# Patient Record
Sex: Male | Born: 1985 | Hispanic: No | Marital: Single | Smoking: Former smoker
Health system: Southern US, Community
[De-identification: ages and names within clinical notes are randomized; demographics above are authoritative.]

## PROBLEM LIST (undated history)

## (undated) ENCOUNTER — Ambulatory Visit (HOSPITAL_COMMUNITY): Admission: EM | Payer: 59

---

## 2009-08-30 ENCOUNTER — Emergency Department (HOSPITAL_COMMUNITY): Admission: EM | Admit: 2009-08-30 | Discharge: 2009-08-30 | Payer: Self-pay | Admitting: Emergency Medicine

## 2012-12-02 ENCOUNTER — Emergency Department (INDEPENDENT_AMBULATORY_CARE_PROVIDER_SITE_OTHER): Payer: BC Managed Care – PPO

## 2012-12-02 ENCOUNTER — Encounter (HOSPITAL_COMMUNITY): Payer: Self-pay | Admitting: Emergency Medicine

## 2012-12-02 ENCOUNTER — Emergency Department (HOSPITAL_COMMUNITY)
Admission: EM | Admit: 2012-12-02 | Discharge: 2012-12-02 | Disposition: A | Discharge status: Home / Self Care | Payer: BC Managed Care – PPO | Source: Home / Self Care | Attending: Family Medicine | Admitting: Family Medicine

## 2012-12-02 DIAGNOSIS — S62609A Fracture of unspecified phalanx of unspecified finger, initial encounter for closed fracture: Secondary | ICD-10-CM

## 2012-12-02 NOTE — ED Notes (Addendum)
C/o injury to right middle finger. States a ball hit finger. Swelling and unable to bend finger. Incident happened on Saturday.

## 2012-12-02 NOTE — ED Provider Notes (Signed)
[name]: [id number]     Arrival date & time 12/02/12  1252 History   First MD Initiated Contact with Patient 12/02/12 1512     Chief Complaint  Patient presents with  . Hand Injury    right middle finger. swelling and unable to bend   (Consider location/radiation/quality/duration/timing/severity/associated sxs/prior Treatment) HPI Comments: Pt was playing basketball and injured distal R middle finger. Used splint from [facility] but finger isn't getting better.   Patient is a 27 y.o. male presenting with hand injury. The history is provided by the patient.  Hand Injury Location:  Finger Time since incident:  2 days Finger location:  R middle finger Pain details:    Quality:  Throbbing and aching   Radiates to:  Does not radiate   Severity:  Mild   Onset quality:  Sudden   Duration:  2 days   Timing:  Constant   Progression:  Unchanged Chronicity:  New Dislocation: no   Relieved by:  Nothing Exacerbated by: trying to move/use finger. Ineffective treatments:  Immobilization Associated symptoms: swelling and tingling   Associated symptoms: no fever and no numbness     History reviewed. No pertinent past medical history. History reviewed. No pertinent past surgical history. History reviewed. No pertinent family history. History  Substance Use Topics  . Smoking status: Current Every Day Smoker -- 0.50 packs/day    Types: Cigarettes  . Smokeless tobacco: Not on file  . Alcohol Use: Yes    Review of Systems  Constitutional: Negative for fever and chills.  Musculoskeletal: Positive for joint swelling.       R middle finger injury  Skin: Negative for wound.  Neurological: Negative for numbness.    Allergies  Review of patient's allergies indicates no known allergies.  Home Medications  No current outpatient prescriptions on file. BP 146/84  Pulse 71  Temp(Src) 97.9 F (36.6 C) (Oral)  Resp 16  SpO2 100% Physical Exam  Constitutional: He is oriented to person, place, and  time. He appears well-developed and well-nourished. No distress.  Musculoskeletal:       Right hand: He exhibits decreased range of motion, tenderness, bony tenderness and swelling.  Swelling R middle finger, pain in distal phalanx and DIP, can't move DIP, can flex and extend PIP.   Neurological: He is alert and oriented to person, place, and time.  Skin: Skin is warm, dry and intact.    ED Course  Procedures (including critical care time) Labs Review Labs Reviewed - No data to display Imaging Review Dg Finger Middle Right  12/02/2012   CLINICAL DATA:  Traumatic injury with pain  EXAM: RIGHT MIDDLE FINGER 2+V  COMPARISON:  None.  FINDINGS: There is an oblique fracture through the dorsal aspect of the 3rd distal phalanx which extends to the distal interphalangeal joint. Mild displacement of the fracture fragments is noted. No other focal abnormality is seen.  IMPRESSION: Fracture through the 3rd distal phalanx.   Electronically Signed   By: Alcide Clever M.D.   On: 12/02/2012 14:28    EKG Interpretation     Ventricular Rate:    PR Interval:    QRS Duration:   QT Interval:    QTC Calculation:   R Axis:     Text Interpretation:              MDM   1. Finger fracture, right, closed, initial encounter   spoke with Molly Maduro, Dr. Ronie Spies PA. Dr. Mina Marble requests splinting and will see pt later this  week.  Offered pt pain medicine but pt declines.      Cathlyn Parsons, NP 12/02/12 1531

## 2012-12-06 NOTE — ED Provider Notes (Signed)
Medical screening examination/treatment/procedure(s) were performed by resident physician or non-physician practitioner and as supervising physician I was immediately available for consultation/collaboration.   Arian Murley DOUGLAS MD.   Minami Arriaga D Amilya Haver, MD 12/06/12 1844 

## 2014-05-10 ENCOUNTER — Encounter (HOSPITAL_BASED_OUTPATIENT_CLINIC_OR_DEPARTMENT_OTHER): Payer: Self-pay

## 2014-05-10 ENCOUNTER — Emergency Department (HOSPITAL_BASED_OUTPATIENT_CLINIC_OR_DEPARTMENT_OTHER)
Admission: EM | Admit: 2014-05-10 | Discharge: 2014-05-10 | Disposition: A | Discharge status: Home / Self Care | Payer: 59 | Attending: Emergency Medicine | Admitting: Emergency Medicine

## 2014-05-10 DIAGNOSIS — R0789 Other chest pain: Secondary | ICD-10-CM | POA: Diagnosis not present

## 2014-05-10 DIAGNOSIS — R079 Chest pain, unspecified: Secondary | ICD-10-CM | POA: Diagnosis present

## 2014-05-10 DIAGNOSIS — K219 Gastro-esophageal reflux disease without esophagitis: Secondary | ICD-10-CM | POA: Insufficient documentation

## 2014-05-10 DIAGNOSIS — Z720 Tobacco use: Secondary | ICD-10-CM | POA: Diagnosis not present

## 2014-05-10 DIAGNOSIS — Z79899 Other long term (current) drug therapy: Secondary | ICD-10-CM | POA: Insufficient documentation

## 2014-05-10 NOTE — ED Notes (Signed)
Dr. Dione BoozeJacobawitz stated patient is ready for discharge.

## 2014-05-10 NOTE — ED Notes (Signed)
Pt reports one months of intermittent palpitations and pain in different spots L chest wall.  Went to [facility], dx with gerd and given omeprazole which he states has helped pain but still is having pain.  Reports requested a stress test but has not received yet.

## 2014-05-10 NOTE — Discharge Instructions (Signed)
Chest Pain (Nonspecific) Keep your scheduled appointment for your stress test later this month. Ask your primary care physician to help you to stop smoking. It is often hard to give a diagnosis for the cause of chest pain. There is always a chance that your pain could be related to something serious, such as a heart attack or a blood clot in the lungs. You need to follow up with your doctor. HOME CARE  If antibiotic medicine was given, take it as directed by your doctor. Finish the medicine even if you start to feel better.  For the next few days, avoid activities that bring on chest pain. Continue physical activities as told by your doctor.  Do not use any tobacco products. This includes cigarettes, chewing tobacco, and e-cigarettes.  Avoid drinking alcohol.  Only take medicine as told by your doctor.  Follow your doctor's suggestions for more testing if your chest pain does not go away.  Keep all doctor visits you made. GET HELP IF:  Your chest pain does not go away, even after treatment.  You have a rash with blisters on your chest.  You have a fever. GET HELP RIGHT AWAY IF:   You have more pain or pain that spreads to your arm, neck, jaw, back, or belly (abdomen).  You have shortness of breath.  You cough more than usual or cough up blood.  You have very bad back or belly pain.  You feel sick to your stomach (nauseous) or throw up (vomit).  You have very bad weakness.  You pass out (faint).  You have chills. This is an emergency. Do not wait to see if the problems will go away. Call your local emergency services (911 in [place] Do not drive yourself to the hospital. MAKE SURE YOU:   Understand these instructions.  Will watch your condition.  Will get help right away if you are not doing well or get worse. Document Released: 07/12/2007 Document Revised: 01/28/2013 Document Reviewed: 07/12/2007 [facility] Patient Information 2015 [place], [place]. This information is  not intended to replace advice given to you by your health care provider. Make sure you discuss any questions you have with your health care provider.

## 2014-05-10 NOTE — ED Provider Notes (Addendum)
[name]: [id number]     Arrival date & time 05/10/14  1812 History  This chart was scribed for Doug Sou, MD by Tonye Royalty, ED Scribe. This patient was seen in room MH08/MH08 and the patient's care was started at 8:18 PM.    Chief Complaint  Patient presents with  . Chest Pain   The history is provided by the patient. No language interpreter was used.    HPI Comments: Scott Olsen is a 29 y.o. male who presents to the Emergency Department complaining of intermittent, sharp chest pain and palpitations with onset 1 month ago, lasting a few seconds at a time. He describes pain as sharp and locates it to central chest. He was seen at [facility] where he was diagnosed with GERD and prescribed Omeprazole. He states frequency decreased with Omeprazole, but states he has been having it more this week. He states it also affects the left lateral ribs and other areas now including left leg, toes, shoulder, arm, and fingers at times. He states symptoms usually occur after eating. He states it is not affected by exertion. He states he is scheduled for stress test at [facility] on April 13. He states he has PCP at [facility]. He denies history of DM, HTN, or HLD. He smokes. He denies FHx of MI or other health problems. He states he does not use any other medications regularly.patient is presently asymptomatic  History reviewed. No pertinent past medical history. medical history negative History reviewed. No pertinent past surgical history. No family history on file. History  Substance Use Topics  . Smoking status: Current Every Day Smoker -- 0.25 packs/day    Types: Cigarettes  . Smokeless tobacco: Not on file  . Alcohol Use: Yes    Review of Systems  Constitutional: Negative.   HENT: Negative.   Respiratory: Negative.   Cardiovascular: Positive for chest pain.  Gastrointestinal: Negative.   Musculoskeletal: Negative.   Skin: Negative.   Neurological: Negative.    Psychiatric/Behavioral: Negative.   All other systems reviewed and are negative.     Allergies  Review of patient's allergies indicates no known allergies.  Home Medications   Prior to Admission medications   Medication Sig Start Date End Date Taking? Authorizing Provider  omeprazole (PRILOSEC) 20 MG capsule Take 20 mg by mouth daily.   Yes Historical Provider, MD   BP 126/70 mmHg  Pulse 86  Temp(Src) 98.1 F (36.7 C) (Oral)  Resp 18  Ht  (1.803 m)  Wt 172 lb (78.019 kg)  BMI 24.00 kg/m2  SpO2 100% Physical Exam  Constitutional: He is oriented to person, place, and time. He appears well-developed and well-nourished.  HENT:  Head: Normocephalic and atraumatic.  Eyes: Conjunctivae are normal. Pupils are equal, round, and reactive to light.  Neck: Neck supple. No tracheal deviation present. No thyromegaly present.  Cardiovascular: Normal rate and regular rhythm.   No murmur heard. Pulmonary/Chest: Effort normal and breath sounds normal.  Abdominal: Soft. Bowel sounds are normal. He exhibits no distension. There is no tenderness.  Musculoskeletal: Normal range of motion. He exhibits no edema or tenderness.  Neurological: He is alert and oriented to person, place, and time. No cranial nerve deficit. Coordination normal.  Skin: Skin is warm and dry. No rash noted.  Psychiatric: He has a normal mood and affect.  Nursing note and vitals reviewed.   ED Course  Procedures (including critical care time)  DIAGNOSTIC STUDIES: Oxygen Saturation is 100% on room air, normal by my interpretation.  COORDINATION OF CARE: 8:27 PM Discussed treatment plan with patient at beside, the patient agrees with the plan and has no further questions at this time.   Labs Review Labs Reviewed - No data to display  Imaging Review No results found.   EKG Interpretation   Date/Time:  Sunday May 10 2014 18:46:49 EDT Ventricular Rate:  71 PR Interval:  158 QRS Duration: 86 QT  Interval:  358 QTC Calculation: 389 R Axis:   50 Text Interpretation:  Normal sinus rhythm Normal ECG No old tracing to  compare Confirmed by Ethelda ChickJACUBOWITZ  MD, Sandy Haye [phone]([id number]) on 05/10/2014 7:01:10 PM      MDM  Symptoms highly atypical for acute coronary syndrome in this young male with only risk factor being smoking withnormal EKG I counseled patient for 5 minutes on smoking cessation. Plan he is to keep his scheduled point for his stress test later this month. He is advised to ask his primary care physician to help him to stop smoking Diagnoses #1 atypical chest pain #2 tobacco abuse Final diagnoses:  None    I personally performed the services described in this documentation, which was scribed in my presence. The recorded information has been reviewed and considered.      Doug SouSam Yanilen Adamik, MD 05/10/14 [phone]  Doug SouSam Rasaan Brotherton, MD 05/10/14 2037

## 2014-08-18 ENCOUNTER — Emergency Department (HOSPITAL_COMMUNITY)
Admission: EM | Admit: 2014-08-18 | Discharge: 2014-08-18 | Disposition: A | Discharge status: Home / Self Care | Payer: 59 | Attending: Emergency Medicine | Admitting: Emergency Medicine

## 2014-08-18 ENCOUNTER — Emergency Department (HOSPITAL_COMMUNITY): Payer: 59

## 2014-08-18 ENCOUNTER — Encounter (HOSPITAL_COMMUNITY): Payer: Self-pay | Admitting: Emergency Medicine

## 2014-08-18 DIAGNOSIS — R42 Dizziness and giddiness: Secondary | ICD-10-CM | POA: Insufficient documentation

## 2014-08-18 DIAGNOSIS — R079 Chest pain, unspecified: Secondary | ICD-10-CM | POA: Diagnosis present

## 2014-08-18 DIAGNOSIS — Z720 Tobacco use: Secondary | ICD-10-CM | POA: Diagnosis not present

## 2014-08-18 DIAGNOSIS — R110 Nausea: Secondary | ICD-10-CM | POA: Insufficient documentation

## 2014-08-18 DIAGNOSIS — R5383 Other fatigue: Secondary | ICD-10-CM | POA: Diagnosis not present

## 2014-08-18 DIAGNOSIS — Z79899 Other long term (current) drug therapy: Secondary | ICD-10-CM | POA: Insufficient documentation

## 2014-08-18 LAB — CBC WITH DIFFERENTIAL/PLATELET
Basophils Absolute: 0 10*3/uL (ref 0.0–0.1)
Basophils Relative: 0 % (ref 0–1)
Eosinophils Absolute: 0.1 10*3/uL (ref 0.0–0.7)
Eosinophils Relative: 1 % (ref 0–5)
HCT: 44.3 % (ref 39.0–52.0)
Hemoglobin: 15.6 g/dL (ref 13.0–17.0)
Lymphocytes Relative: 35 % (ref 12–46)
Lymphs Abs: 2.1 10*3/uL (ref 0.7–4.0)
MCH: 31 pg (ref 26.0–34.0)
MCHC: 35.2 g/dL (ref 30.0–36.0)
MCV: 87.9 fL (ref 78.0–100.0)
Monocytes Absolute: 0.5 10*3/uL (ref 0.1–1.0)
Monocytes Relative: 8 % (ref 3–12)
Neutro Abs: 3.4 10*3/uL (ref 1.7–7.7)
Neutrophils Relative %: 56 % (ref 43–77)
Platelets: 92 10*3/uL — ABNORMAL LOW (ref 150–400)
RBC: 5.04 MIL/uL (ref 4.22–5.81)
RDW: 12.8 % (ref 11.5–15.5)
WBC: 6.1 10*3/uL (ref 4.0–10.5)

## 2014-08-18 LAB — BASIC METABOLIC PANEL
Anion gap: 9 (ref 5–15)
BUN: 11 mg/dL (ref 6–20)
CO2: 23 mmol/L (ref 22–32)
Calcium: 9.6 mg/dL (ref 8.9–10.3)
Chloride: 106 mmol/L (ref 101–111)
Creatinine, Ser: 1.11 mg/dL (ref 0.61–1.24)
GFR calc Af Amer: >60 mL/min (ref >60)
GFR calc non Af Amer: >60 mL/min (ref >60)
Glucose, Bld: 112 mg/dL — ABNORMAL HIGH (ref 65–99)
Potassium: 3.8 mmol/L (ref 3.5–5.1)
Sodium: 138 mmol/L (ref 135–145)

## 2014-08-18 LAB — I-STAT TROPONIN, ED: Troponin i, poc: 0.01 ng/mL (ref 0.00–0.08)

## 2014-08-18 NOTE — ED Provider Notes (Signed)
[name]: [id number]     Arrival date & time 08/18/14  0865 History   First MD Initiated Contact with Patient 08/18/14 [phone]     Chief Complaint  Patient presents with  . Chest Pain  . Dizziness  . Fatigue     (Consider location/radiation/quality/duration/timing/severity/associated sxs/prior Treatment) HPI  29 year old male presents with 2 days of intermittent left-sided chest pain. The pain is a sharp, stabbing pain that comes and goes over a couple seconds. No associate shortness of breath or sweating. This morning he felt dizzy, tired, and nauseated and so he came in for evaluation. He has had chest pain similar to this 3 months ago. He was scheduled to get a stress test but was told that it was likely GERD so he did not follow-up with the stress test. The patient also complains of left arm pain but states that has been going on for several weeks and is always worse in the morning. He always sleeps on his left arm and thinks this is contributing. The arm pain in the chest pain did not occur the same time. Currently he has no chest pain. No pleuritic symptoms, cough, history of DVT, leg swelling, or history of cancer.  History reviewed. No pertinent past medical history. History reviewed. No pertinent past surgical history. No family history on file. History  Substance Use Topics  . Smoking status: Current Every Day Smoker -- 0.25 packs/day    Types: Cigarettes  . Smokeless tobacco: Not on file  . Alcohol Use: Yes    Review of Systems  Constitutional: Positive for fatigue.  Respiratory: Negative for shortness of breath.   Cardiovascular: Positive for chest pain.  Gastrointestinal: Positive for nausea. Negative for vomiting.  Neurological: Positive for dizziness and weakness.  All other systems reviewed and are negative.     Allergies  Review of patient's allergies indicates no known allergies.  Home Medications   Prior to Admission medications   Medication Sig Start Date End  Date Taking? Authorizing Provider  omeprazole (PRILOSEC) 20 MG capsule Take 20 mg by mouth daily.    Historical Provider, MD   BP 117/76 mmHg  Pulse 71  Temp(Src) 98.9 F (37.2 C)  Resp 17  Ht  (1.803 m)  Wt 159 lb 8 oz (72.349 kg)  BMI 22.26 kg/m2  SpO2 100% Physical Exam  Constitutional: He is oriented to person, place, and time. He appears well-developed and well-nourished.  HENT:  Head: Normocephalic and atraumatic.  Right Ear: External ear normal.  Left Ear: External ear normal.  Nose: Nose normal.  Eyes: Right eye exhibits no discharge. Left eye exhibits no discharge.  Neck: Neck supple.  Cardiovascular: Normal rate, regular rhythm, normal heart sounds and intact distal pulses.   Pulses:      Radial pulses are 2+ on the right side, and 2+ on the left side.  Pulmonary/Chest: Effort normal and breath sounds normal. He exhibits no tenderness.  Abdominal: Soft. He exhibits no distension. There is no tenderness.  Musculoskeletal: He exhibits no edema.       Left shoulder: He exhibits normal range of motion and no tenderness.       Left upper arm: He exhibits no tenderness.  Neurological: He is alert and oriented to person, place, and time.  Skin: Skin is warm and dry.  Nursing note and vitals reviewed.   ED Course  Procedures (including critical care time) Labs Review Labs Reviewed  BASIC METABOLIC PANEL - Abnormal; Notable for the following:  Glucose, Bld 112 (*)    All other components within normal limits  CBC WITH DIFFERENTIAL/PLATELET - Abnormal; Notable for the following:    Platelets 92 (*)    All other components within normal limits  I-STAT TROPOININ, ED    Imaging Review Dg Chest 2 View  08/18/2014   CLINICAL DATA:  Four day history of intermittent chest pain and dizziness  EXAM: CHEST  2 VIEW  COMPARISON:  None.  FINDINGS: Lungs are clear. Heart size and pulmonary vascularity are normal. No adenopathy. No bone lesions.  IMPRESSION: No edema or  consolidation.   Electronically Signed   By: Bretta BangWilliam  Woodruff III M.D.   On: 08/18/2014 09:30     EKG Interpretation   Date/Time:  Tuesday August 18 2014 08:41:47 EDT Ventricular Rate:  71 PR Interval:  152 QRS Duration: 86 QT Interval:  338 QTC Calculation: 367 R Axis:   74 Text Interpretation:  Normal sinus rhythm Normal ECG no significant change  since April 2016 Confirmed by Criss AlvineGOLDSTON  MD, Kenyon Eshleman [phone]([id number]) on 08/18/2014  8:48:47 AM       EKG Interpretation  Date/Time:  Tuesday August 18 2014 09:50:50 EDT Ventricular Rate:  70 PR Interval:  158 QRS Duration: 96 QT Interval:  344 QTC Calculation: 371 R Axis:   68 Text Interpretation:  Sinus rhythm RSR' in V1 or V2, probably normal variant ST elev, probable normal early repol pattern No significant change since earlier in the day Confirmed by Shamarion Coots  MD, Damier Disano ([id number]) on 08/18/2014 9:56:04 AM       MDM   Final diagnoses:  Chest pain, unspecified chest pain type    Patient with atypical chest pain. His only risk factor is smoking which I have counseled him on stopping. His history is very unlikely to be dissection or pulmonary moles and. He is low risk for both with a negative PERC I dont feel further PE workup is needed. He is currently asymptomatic. EKG is unremarkable except for early repolarization, repeat EKG shows no different changes. With a negative troponin after a couple days of symptoms and did not feel further workup is indicated in the ED, he has a PCP and have recommended he follow up with them. Discussed strict return precautions.    Pricilla LovelessScott Corabelle Spackman, MD 08/18/14 1017

## 2014-08-18 NOTE — ED Notes (Signed)
Pt. Stated, I've had some chest pain off and on with dizziness since Sat.

## 2014-08-18 NOTE — Discharge Instructions (Signed)

## 2014-11-03 ENCOUNTER — Ambulatory Visit (INDEPENDENT_AMBULATORY_CARE_PROVIDER_SITE_OTHER): Payer: 59

## 2014-11-03 ENCOUNTER — Ambulatory Visit (INDEPENDENT_AMBULATORY_CARE_PROVIDER_SITE_OTHER): Payer: 59 | Admitting: Family Medicine

## 2014-11-03 VITALS — BP 122/80 | HR 105 | Temp 101.5°F | Resp 18 | Ht 71.0 in | Wt 161.0 lb

## 2014-11-03 DIAGNOSIS — J988 Other specified respiratory disorders: Secondary | ICD-10-CM | POA: Diagnosis not present

## 2014-11-03 DIAGNOSIS — R05 Cough: Secondary | ICD-10-CM

## 2014-11-03 DIAGNOSIS — Z720 Tobacco use: Secondary | ICD-10-CM | POA: Diagnosis not present

## 2014-11-03 DIAGNOSIS — J029 Acute pharyngitis, unspecified: Secondary | ICD-10-CM | POA: Diagnosis not present

## 2014-11-03 DIAGNOSIS — R059 Cough, unspecified: Secondary | ICD-10-CM

## 2014-11-03 LAB — POCT RAPID STREP A (OFFICE): Rapid Strep A Screen: NEGATIVE

## 2014-11-03 MED ORDER — BENZONATATE 100 MG PO CAPS: 100.0000 mg | ORAL_CAPSULE | Freq: Three times a day (TID) | ORAL | Status: DC | PRN

## 2014-11-03 MED ORDER — AZITHROMYCIN 250 MG PO TABS: ORAL_TABLET | ORAL | Status: DC

## 2014-11-03 MED ORDER — HYDROCODONE-HOMATROPINE 5-1.5 MG/5ML PO SYRP: 5.0000 mL | ORAL_SOLUTION | Freq: Every evening | ORAL | Status: DC | PRN

## 2014-11-03 NOTE — Progress Notes (Signed)
    MRN: [id number] DOB: 1985/10/28  Subjective:   Scott Olsen is a 29 y.o. male presenting for chief complaint of Sore Throat; Fever; and Nasal Congestion  Reports 1 day history of sore throat, fever, sinus congestion, intermittent cough. Smokes 4-5 cigarettes daily for the past 10 years. Denies sinus pain, itchy or watery red eyes, ear pain, ear drainage, tooth pain, chest pain, shob, wheezing, n/v, abdominal pain. Denies history of allergies, asthma. Denies any other aggravating or relieving factors, no other questions or concerns.  Scott Olsen Scott has no medications in their medication list. Also has No Known Allergies.  Scott Olsen  has no past medical history on file. Also  has no past surgical history on file.  Objective:   Vitals: BP 122/80 mmHg  Pulse 105  Temp(Src) 101.5 F (38.6 C) (Oral)  Resp 18  Ht  (1.803 m)  Wt 161 lb (73.029 kg)  BMI 22.46 kg/m2  SpO2 98%  Physical Exam  Constitutional: He is oriented to person, place, and time. He appears well-developed and well-nourished.  HENT:  TM's intact bilaterally, no effusions or erythema. Nares patent, nasal turbinates pink and moist, nasal passages patent. No sinus tenderness. Oropharynx clear, mucous membranes moist, dentition in good repair.  Eyes: Conjunctivae are normal. No scleral icterus.  Neck: Normal range of motion. Neck supple.  Cardiovascular: Normal rate, regular rhythm and intact distal pulses.  Exam reveals no gallop and no friction rub.   No murmur heard. Pulmonary/Chest: No respiratory distress. He has no wheezes. He has rales (upper left lung fields).  Lymphadenopathy:    He has no cervical adenopathy.  Neurological: He is alert and oriented to person, place, and time.  Skin: Skin is warm and dry. No rash noted. No erythema. No pallor.   Results for orders placed or performed in visit on 11/03/14 (from the past 24 hour(s))  POCT rapid strep A     Status: Normal   Collection Time:  11/03/14  8:16 PM  Result Value Ref Range   Rapid Strep A Screen Negative Negative   [facility] reading (PRIMARY) by  Dr. Conley Rolls and PA-Fardeen Steinberger. Chest - no acute process.  Assessment and Plan :   1. Respiratory infection 2. Sore throat 3. Cough - Likely viral process. Offered supportive care, patient is to start Azithromycin in 3 days if no improvement.  4. Tobacco use - Patient will try to quit on his own and consider starting oral medication in 4 weeks if he is unable to do so. He will follow up with me in that case.  Wallis Bamberg, PA-C [facility] [phone] 11/03/2014 7:56 PM

## 2014-11-05 LAB — CULTURE, GROUP A STREP: Organism ID, Bacteria: NORMAL

## 2014-11-06 NOTE — Progress Notes (Signed)
Agree wit A/P Dr Conley Rolls

## 2015-01-28 ENCOUNTER — Ambulatory Visit (INDEPENDENT_AMBULATORY_CARE_PROVIDER_SITE_OTHER): Payer: 59 | Admitting: Family Medicine

## 2015-01-28 ENCOUNTER — Ambulatory Visit (INDEPENDENT_AMBULATORY_CARE_PROVIDER_SITE_OTHER): Payer: 59

## 2015-01-28 VITALS — BP 122/72 | HR 82 | Temp 98.4°F | Resp 17 | Ht 70.5 in | Wt 165.0 lb

## 2015-01-28 DIAGNOSIS — D696 Thrombocytopenia, unspecified: Secondary | ICD-10-CM | POA: Diagnosis not present

## 2015-01-28 DIAGNOSIS — R1031 Right lower quadrant pain: Secondary | ICD-10-CM | POA: Diagnosis not present

## 2015-01-28 LAB — POCT CBC
Granulocyte percent: 64.3 %G (ref 37–80)
HCT, POC: 45.3 % (ref 43.5–53.7)
Hemoglobin: 15.7 g/dL (ref 14.1–18.1)
Lymph, poc: 2.4 (ref 0.6–3.4)
MCH, POC: 31.4 pg — AB (ref 27–31.2)
MCHC: 34.7 g/dL (ref 31.8–35.4)
MCV: 90.4 fL (ref 80–97)
MID (cbc): 0.4 (ref 0–0.9)
MPV: 9.1 fL (ref 0–99.8)
POC Granulocyte: 5.1 (ref 2–6.9)
POC LYMPH PERCENT: 31 %L (ref 10–50)
POC MID %: 4.7 %M (ref 0–12)
Platelet Count, POC: 88 10*3/uL — AB (ref 142–424)
RBC: 5.01 M/uL (ref 4.69–6.13)
RDW, POC: 12.9 %
WBC: 7.9 10*3/uL (ref 4.6–10.2)

## 2015-01-28 LAB — COMPREHENSIVE METABOLIC PANEL
ALT: 38 U/L (ref 9–46)
AST: 22 U/L (ref 10–40)
Albumin: 4.9 g/dL (ref 3.6–5.1)
Alkaline Phosphatase: 51 U/L (ref 40–115)
BUN: 11 mg/dL (ref 7–25)
CO2: 26 mmol/L (ref 20–31)
Calcium: 9.9 mg/dL (ref 8.6–10.3)
Chloride: 103 mmol/L (ref 98–110)
Creat: 1.07 mg/dL (ref 0.60–1.35)
Glucose, Bld: 89 mg/dL (ref 65–99)
Potassium: 4.3 mmol/L (ref 3.5–5.3)
Sodium: 137 mmol/L (ref 135–146)
Total Bilirubin: 1.3 mg/dL — ABNORMAL HIGH (ref 0.2–1.2)
Total Protein: 7.6 g/dL (ref 6.1–8.1)

## 2015-01-28 LAB — POCT URINALYSIS DIP (MANUAL ENTRY)
Bilirubin, UA: NEGATIVE
Blood, UA: NEGATIVE
Glucose, UA: NEGATIVE
Ketones, POC UA: NEGATIVE
Leukocytes, UA: NEGATIVE
Nitrite, UA: NEGATIVE
Protein Ur, POC: NEGATIVE
Spec Grav, UA: 1.015
Urobilinogen, UA: 0.2
pH, UA: 7.5

## 2015-01-28 LAB — POC MICROSCOPIC URINALYSIS (UMFC): Mucus: ABSENT

## 2015-01-28 NOTE — Patient Instructions (Addendum)
I will check the rest of your labs and will be in touch with you asap However at this time I do not see evidence of any dangerous problem.   Please come in for a lab visit only to recheck your platelets in about 2 weeks   If you have any more severe or persistent pain please seek help with us or the emergency room!

## 2015-01-28 NOTE — Progress Notes (Addendum)
[facility] [address] [place] [address] [phone]  Date:  01/28/2015   Name:  Scott Olsen   DOB:  Sep 04, 1985   MRN:  [id number]  PCP:  No PCP Per Patient    Chief Complaint: Abdominal Pain   History of Present Illness:  Viyaan Champine Krack is a 29 y.o. very pleasant male patient who presents with the following:  He has noted right sided abd pain since yesterday evening,  It will come and go.  It is a sharp pain, sporadic that may last for 2-3 seconds.   He tried to eat this am- he did eat a breakfast sandwich No dysuria, no hematuria No vomiting- he did have a little nausea after he ate this am.    He stopped smoking a week ago- this seemed to cause him to have some pains in various areas of his body.  This is getting better.  No diarreha, no constipation No genital pain or concerns Never had any surgery He is generally in good health   There are no active problems to display for this patient.   No past medical history on file.  No past surgical history on file.  Social History  Substance Use Topics  . Smoking status: Former Smoker -- 0.25 packs/day for 10 years    Types: Cigarettes    Quit date: 01/21/2015  . Smokeless tobacco: None  . Alcohol Use: 1.2 oz/week    2 Standard drinks or equivalent per week    Family History  Problem Relation Age of Onset  . Diabetes Father   . Hypertension Father     No Known Allergies  Medication list has been reviewed and updated.  No current outpatient prescriptions on file prior to visit.   No current facility-administered medications on file prior to visit.    Review of Systems:  As per HPI- otherwise negative.   Physical Examination: Filed Vitals:   01/28/15 1240  BP: 122/72  Pulse: 82  Temp: 98.4 F (36.9 C)  Resp: 17   Filed Vitals:   01/28/15 1240  Height: 5' 10.5" (1.791 m)  Weight: 165 lb (74.844 kg)   Body mass index is 23.33 kg/(m^2). Ideal Body  Weight: Weight in (lb) to have BMI = 25: 176.4  GEN: WDWN, NAD, Non-toxic, A & O x 3, looks well HEENT: Atraumatic, Normocephalic. Neck supple. No masses, No LAD.  Bilateral TM wnl, oropharynx normal.  PEERL,EOMI.   Ears and Nose: No external deformity. CV: RRR, No M/G/R. No JVD. No thrill. No extra heart sounds. PULM: CTA B, no wheezes, crackles, rhonchi. No retractions. No resp. distress. No accessory muscle use. ABD: S, NT, ND, +BS. No rebound. No HSM.  No abd tenderness at this time- belly is benign. Negative table jar EXTR: No c/c/e NEURO Normal gait.  PSYCH: Normally interactive. Conversant. Not depressed or anxious appearing.  Calm demeanor.   [facility] reading (PRIMARY) by  Dr. Patsy Lager. KUB: negative  CLINICAL DATA: Right lower quadrant pain of unknown origin or duration. Initial encounter.  EXAM: ABDOMEN - 1 VIEW  COMPARISON: None.  FINDINGS: The bowel gas pattern is normal. No radio-opaque calculi or other significant radiographic abnormality are seen.  IMPRESSION: Negative exam.   Results for orders placed or performed in visit on 01/28/15  POCT CBC  Result Value Ref Range   WBC 7.9 4.6 - 10.2 K/uL   Lymph, poc 2.4 0.6 - 3.4   POC LYMPH PERCENT 31.0 10 -  50 %L   MID (cbc) 0.4 0 - 0.9   POC MID % 4.7 0 - 12 %M   POC Granulocyte 5.1 2 - 6.9   Granulocyte percent 64.3 37 - 80 %G   RBC 5.01 4.69 - 6.13 M/uL   Hemoglobin 15.7 14.1 - 18.1 g/dL   HCT, POC 16.145.3 09.643.5 - 53.7 %   MCV 90.4 80 - 97 fL   MCH, POC 31.4 (A) 27 - 31.2 pg   MCHC 34.7 31.8 - 35.4 g/dL   RDW, POC 04.512.9 %   Platelet Count, POC 88.0 (A) 142 - 424 K/uL   MPV 9.1 0 - 99.8 fL  POCT urinalysis dipstick  Result Value Ref Range   Color, UA yellow yellow   Clarity, UA clear clear   Glucose, UA negative negative   Bilirubin, UA negative negative   Ketones, POC UA negative negative   Spec Grav, UA 1.015    Blood, UA negative negative   pH, UA 7.5    Protein Ur, POC negative negative    Urobilinogen, UA 0.2    Nitrite, UA Negative Negative   Leukocytes, UA Negative Negative  POCT Microscopic Urinalysis ([facility])  Result Value Ref Range   WBC,UR,HPF,POC None None WBC/hpf   RBC,UR,HPF,POC None None RBC/hpf   Bacteria None None, Too numerous to count   Mucus Absent Absent   Epithelial Cells, UR Per Microscopy None None, Too numerous to count cells/hpf     Assessment and Plan: Right lower quadrant pain - Plan: POCT CBC, POCT urinalysis dipstick, POCT Microscopic Urinalysis ([facility]), Comprehensive metabolic panel, Urine culture, DG Abd 1 View, CBC  Thrombocytopenia (HCC)   Here today with intermittent episodes of sharp RLQ pain Currently he is pain free.  Belly is benign.  Discussed with pt in detail- doubt appendicitis as he has no tachycardia, fever, abd tenderness or leukocytosis.  Discussed CT scan but after hearing risks and benefits he does not want to pursue at this point  Noted thrombocytopenia- looking back his plts were at 92K in July of this year.   Reassured that his plts are not dangerously low but asked him to come in for a lab visit only in a couple of weeks to recheck.  If persistent consider referral to hematology    Signed Abbe AmsterdamJessica Brodee Mauritz, MD  Called on 12/23 to check on him- he is feeling fine.  Advised that his CMP looks fine

## 2015-01-29 ENCOUNTER — Encounter: Payer: Self-pay | Admitting: Family Medicine

## 2015-01-29 LAB — URINE CULTURE
Colony Count: NO GROWTH
Organism ID, Bacteria: NO GROWTH

## 2016-03-15 ENCOUNTER — Emergency Department (HOSPITAL_COMMUNITY)
Admission: EM | Admit: 2016-03-15 | Discharge: 2016-03-15 | Disposition: A | Discharge status: Home / Self Care | Payer: Managed Care, Other (non HMO) | Attending: Emergency Medicine | Admitting: Emergency Medicine

## 2016-03-15 ENCOUNTER — Encounter (HOSPITAL_COMMUNITY): Payer: Self-pay | Admitting: *Deleted

## 2016-03-15 ENCOUNTER — Emergency Department (HOSPITAL_COMMUNITY): Payer: Managed Care, Other (non HMO)

## 2016-03-15 DIAGNOSIS — R1011 Right upper quadrant pain: Secondary | ICD-10-CM

## 2016-03-15 DIAGNOSIS — R1010 Upper abdominal pain, unspecified: Secondary | ICD-10-CM | POA: Diagnosis present

## 2016-03-15 DIAGNOSIS — Z87891 Personal history of nicotine dependence: Secondary | ICD-10-CM | POA: Insufficient documentation

## 2016-03-15 LAB — COMPREHENSIVE METABOLIC PANEL
ALT: 45 U/L (ref 17–63)
AST: 32 U/L (ref 15–41)
Albumin: 4.6 g/dL (ref 3.5–5.0)
Alkaline Phosphatase: 53 U/L (ref 38–126)
Anion gap: 13 (ref 5–15)
BUN: 8 mg/dL (ref 6–20)
CO2: 22 mmol/L (ref 22–32)
Calcium: 9.6 mg/dL (ref 8.9–10.3)
Chloride: 100 mmol/L — ABNORMAL LOW (ref 101–111)
Creatinine, Ser: 1.22 mg/dL (ref 0.61–1.24)
GFR calc Af Amer: >60 mL/min (ref >60)
GFR calc non Af Amer: >60 mL/min (ref >60)
Glucose, Bld: 133 mg/dL — ABNORMAL HIGH (ref 65–99)
Potassium: 3.9 mmol/L (ref 3.5–5.1)
Sodium: 135 mmol/L (ref 135–145)
Total Bilirubin: 1 mg/dL (ref 0.3–1.2)
Total Protein: 7.2 g/dL (ref 6.5–8.1)

## 2016-03-15 LAB — URINALYSIS, ROUTINE W REFLEX MICROSCOPIC
Bacteria, UA: NONE SEEN
Bilirubin Urine: NEGATIVE
Glucose, UA: NEGATIVE mg/dL
Hgb urine dipstick: NEGATIVE
Ketones, ur: NEGATIVE mg/dL
Nitrite: NEGATIVE
Protein, ur: NEGATIVE mg/dL
Specific Gravity, Urine: 1.006 (ref 1.005–1.030)
pH: 8 (ref 5.0–8.0)

## 2016-03-15 LAB — CBC
HCT: 42.5 % (ref 39.0–52.0)
Hemoglobin: 15 g/dL (ref 13.0–17.0)
MCH: 31.3 pg (ref 26.0–34.0)
MCHC: 35.3 g/dL (ref 30.0–36.0)
MCV: 88.7 fL (ref 78.0–100.0)
Platelets: 88 10*3/uL — ABNORMAL LOW (ref 150–400)
RBC: 4.79 MIL/uL (ref 4.22–5.81)
RDW: 13.1 % (ref 11.5–15.5)
WBC: 9.8 10*3/uL (ref 4.0–10.5)

## 2016-03-15 LAB — LIPASE, BLOOD: Lipase: 18 U/L (ref 11–51)

## 2016-03-15 MED ORDER — OXYCODONE-ACETAMINOPHEN 5-325 MG PO TABS
1.0000 | ORAL_TABLET | Freq: Once | ORAL | Status: AC
Start: 2016-03-15 — End: 2016-03-15
  Administered 2016-03-15: 1 via ORAL
  Filled 2016-03-15: qty 1

## 2016-03-15 MED ORDER — POLYETHYLENE GLYCOL 3350 17 G PO PACK: 17.0000 g | PACK | Freq: Two times a day (BID) | ORAL | 0 refills | Status: DC | PRN

## 2016-03-15 MED ORDER — PANTOPRAZOLE SODIUM 20 MG PO TBEC: 20.0000 mg | DELAYED_RELEASE_TABLET | Freq: Two times a day (BID) | ORAL | 0 refills | Status: DC

## 2016-03-15 MED ORDER — GI COCKTAIL ~~LOC~~
30.0000 mL | Freq: Once | ORAL | Status: AC
  Administered 2016-03-15: 30 mL via ORAL
  Filled 2016-03-15: qty 30

## 2016-03-15 MED ORDER — PANTOPRAZOLE SODIUM 40 MG PO TBEC
40.0000 mg | DELAYED_RELEASE_TABLET | Freq: Once | ORAL | Status: AC
  Administered 2016-03-15: 40 mg via ORAL
  Filled 2016-03-15: qty 1

## 2016-03-15 NOTE — ED Triage Notes (Signed)
Pt reports abdominal pain and N/V since 3am.

## 2016-03-27 NOTE — ED Provider Notes (Signed)
[facility]PT Provider Note   [name]: [id number] Arrival date & time: 03/15/16  0732     History   Chief Complaint Chief Complaint  Patient presents with  . Abdominal Pain    HPI Scott Olsen is a 31 y.o. male.  HPI   30yM with abdominal pain. Onset around 0300 this morning. Persistent since then. Associated n/v. Pain is in upper abdomen. No appreciable exacerbating or relieving factors. No fever or chills. No diarrhea. No sick contacts. No urinary complaints.   History reviewed. No pertinent past medical history.  There are no active problems to display for this patient.   History reviewed. No pertinent surgical history.     Home Medications    Prior to Admission medications   Medication Sig Start Date End Date Taking? Authorizing Provider  pantoprazole (PROTONIX) 20 MG tablet Take 1 tablet (20 mg total) by mouth 2 (two) times daily before a meal. 03/15/16   Raeford RazorStephen Elisabel Hanover, MD  polyethylene glycol (MIRALAX / Ethelene HalGLYCOLAX) packet Take 17 g by mouth 2 (two) times daily as needed. 03/15/16   Raeford RazorStephen Shaily Librizzi, MD    Family History Family History  Problem Relation Age of Onset  . Diabetes Father   . Hypertension Father     Social History Social History  Substance Use Topics  . Smoking status: Former Smoker    Packs/day: 0.25    Years: 10.00    Types: Cigarettes    Quit date: 01/21/2015  . Smokeless tobacco: Not on file  . Alcohol use 1.2 oz/week    2 Standard drinks or equivalent per week     Allergies   Patient has no known allergies.   Review of Systems Review of Systems  All systems reviewed and negative, other than as noted in HPI.  Physical Exam Updated Vital Signs BP 126/89   Pulse 83   Temp 98.6 F (37 C) (Oral)   Resp 18   Ht 5\' 8"  (1.727 m)   Wt 180 lb (81.6 kg)   SpO2 100%   BMI 27.37 kg/m   Physical Exam  Constitutional: He appears well-developed and well-nourished. No distress.  HENT:  Head: Normocephalic and atraumatic.    Eyes: Conjunctivae are normal. Right eye exhibits no discharge. Left eye exhibits no discharge.  Neck: Neck supple.  Cardiovascular: Normal rate, regular rhythm and normal heart sounds.  Exam reveals no gallop and no friction rub.   No murmur heard. Pulmonary/Chest: Effort normal and breath sounds normal. No respiratory distress.  Abdominal: Soft. He exhibits no distension. There is tenderness.  RUQ and epigastric tenderness w/o rebound or guarding  Musculoskeletal: He exhibits no edema or tenderness.  Neurological: He is alert.  Skin: Skin is warm and dry.  Psychiatric: He has a normal mood and affect. His behavior is normal. Thought content normal.  Nursing note and vitals reviewed.    ED Treatments / Results  Labs (all labs ordered are listed, but only abnormal results are displayed) Labs Reviewed  COMPREHENSIVE METABOLIC PANEL - Abnormal; Notable for the following:       Result Value   Chloride 100 (*)    Glucose, Bld 133 (*)    All other components within normal limits  CBC - Abnormal; Notable for the following:    Platelets 88 (*)    All other components within normal limits  URINALYSIS, ROUTINE W REFLEX MICROSCOPIC - Abnormal; Notable for the following:    Color, Urine STRAW (*)    Leukocytes, UA TRACE (*)  Squamous Epithelial / LPF 0-5 (*)    All other components within normal limits  LIPASE, BLOOD    EKG  EKG Interpretation None       Radiology No results found.   US Abdomen Limited Ruq  Result Date: 03/15/2016 CLINICAL DATA:  Right upper quadrant pain today EXAM: US ABDOMEN LIMITED - RIGHT UPPER QUADRANT COMPARISON:  KUB of 01/28/2015 FINDINGS: Gallbladder: Gallbladder is visualized and no gallstones are noted. There is no pain over the gallbladder with compression. Common bile duct: Diameter: The common bile duct is normal measuring 2.6 mm in diameter. Liver: The liver is diffusely echogenic suggesting fatty infiltration. No focal hepatic abnormality is  seen. IMPRESSION: 1. No gallstones. 2. Echogenic liver parenchyma may indicate diffuse fatty infiltration. Correlate with LFTs. Electronically Signed   By: Dwyane Dee M.D.   On: 03/15/2016 10:24    Procedures Procedures (including critical care time)  Medications Ordered in ED Medications  gi cocktail (Maalox,Lidocaine,Donnatal) (30 mLs Oral Given 03/15/16 0946)  pantoprazole (PROTONIX) EC tablet 40 mg (40 mg Oral Given 03/15/16 0946)  oxyCODONE-acetaminophen (PERCOCET/ROXICET) 5-325 MG per tablet 1 tablet (1 tablet Oral Given 03/15/16 0946)     Initial Impression / Assessment and Plan / ED Course  I have reviewed the triage vital signs and the nursing notes.  Pertinent labs & imaging results that were available during my care of the patient were reviewed by me and considered in my medical decision making (see chart for details).     30yM with abdominal pain. Suspect may be PUD. Doubt atypical acs or other emergent pathology. Trial of PPI. Return precautions discussed.   Final Clinical Impressions(s) / ED Diagnoses   Final diagnoses:  RUQ pain  Pain of upper abdomen    New Prescriptions Discharge Medication List as of 03/15/2016 11:05 AM    START taking these medications   Details  pantoprazole (PROTONIX) 20 MG tablet Take 1 tablet (20 mg total) by mouth 2 (two) times daily before a meal., Starting Wed 03/15/2016, Print    polyethylene glycol (MIRALAX / GLYCOLAX) packet Take 17 g by mouth 2 (two) times daily as needed., Starting Wed 03/15/2016, Print         Raeford Razor, MD 03/27/16 [phone]

## 2020-03-05 ENCOUNTER — Other Ambulatory Visit: Payer: Self-pay | Admitting: Internal Medicine

## 2020-03-05 ENCOUNTER — Telehealth: Payer: Self-pay | Admitting: Hematology and Oncology

## 2020-03-05 DIAGNOSIS — R599 Enlarged lymph nodes, unspecified: Secondary | ICD-10-CM

## 2020-03-05 NOTE — Telephone Encounter (Signed)
Received a new hem referral from Dr. Nadene Rubins for thrombocytopenia. Scott Olsen has been scheduled to see Dr. Al Pimple on 2/3 at 1040am. Pt aware top arrive 20 minutes early.

## 2020-03-11 ENCOUNTER — Inpatient Hospital Stay: Payer: 59 | Attending: Hematology and Oncology | Admitting: Hematology and Oncology

## 2020-03-11 ENCOUNTER — Other Ambulatory Visit: Payer: Self-pay

## 2020-03-11 ENCOUNTER — Inpatient Hospital Stay: Payer: 59

## 2020-03-11 ENCOUNTER — Encounter: Payer: Self-pay | Admitting: Hematology and Oncology

## 2020-03-11 VITALS — BP 126/85 | HR 87 | Temp 97.2°F | Resp 18 | Ht 68.0 in | Wt 169.4 lb

## 2020-03-11 DIAGNOSIS — D696 Thrombocytopenia, unspecified: Secondary | ICD-10-CM

## 2020-03-11 DIAGNOSIS — Z87891 Personal history of nicotine dependence: Secondary | ICD-10-CM | POA: Diagnosis not present

## 2020-03-11 LAB — RETICULOCYTES
Immature Retic Fract: 5.2 % (ref 2.3–15.9)
RBC.: 5.03 MIL/uL (ref 4.22–5.81)
Retic Count, Absolute: 80.5 10*3/uL (ref 19.0–186.0)
Retic Ct Pct: 1.6 % (ref 0.4–3.1)

## 2020-03-11 LAB — CMP (CANCER CENTER ONLY)
ALT: 40 U/L (ref 0–44)
AST: 19 U/L (ref 15–41)
Albumin: 4.9 g/dL (ref 3.5–5.0)
Alkaline Phosphatase: 57 U/L (ref 38–126)
Anion gap: 8 (ref 5–15)
BUN: 13 mg/dL (ref 6–20)
CO2: 27 mmol/L (ref 22–32)
Calcium: 9.7 mg/dL (ref 8.9–10.3)
Chloride: 105 mmol/L (ref 98–111)
Creatinine: 1.18 mg/dL (ref 0.61–1.24)
GFR, Estimated: >60 mL/min (ref >60)
Glucose, Bld: 99 mg/dL (ref 70–99)
Potassium: 4.3 mmol/L (ref 3.5–5.1)
Sodium: 140 mmol/L (ref 135–145)
Total Bilirubin: 1 mg/dL (ref 0.3–1.2)
Total Protein: 8.1 g/dL (ref 6.5–8.1)

## 2020-03-11 LAB — IRON AND TIBC
Iron: 140 ug/dL (ref 42–163)
Saturation Ratios: 37 % (ref 20–55)
TIBC: 381 ug/dL (ref 202–409)
UIBC: 241 ug/dL (ref 117–376)

## 2020-03-11 LAB — CBC WITH DIFFERENTIAL/PLATELET
Abs Immature Granulocytes: 0.01 10*3/uL (ref 0.00–0.07)
Basophils Absolute: 0 10*3/uL (ref 0.0–0.1)
Basophils Relative: 0 %
Eosinophils Absolute: 0.1 10*3/uL (ref 0.0–0.5)
Eosinophils Relative: 1 %
HCT: 45 % (ref 39.0–52.0)
Hemoglobin: 15.8 g/dL (ref 13.0–17.0)
Immature Granulocytes: 0 %
Lymphocytes Relative: 27 %
Lymphs Abs: 1.5 10*3/uL (ref 0.7–4.0)
MCH: 31.5 pg (ref 26.0–34.0)
MCHC: 35.1 g/dL (ref 30.0–36.0)
MCV: 89.6 fL (ref 80.0–100.0)
Monocytes Absolute: 0.4 10*3/uL (ref 0.1–1.0)
Monocytes Relative: 7 %
Neutro Abs: 3.7 10*3/uL (ref 1.7–7.7)
Neutrophils Relative %: 65 %
Platelets: 115 10*3/uL — ABNORMAL LOW (ref 150–400)
RBC: 5.02 MIL/uL (ref 4.22–5.81)
RDW: 12.2 % (ref 11.5–15.5)
WBC: 5.7 10*3/uL (ref 4.0–10.5)
nRBC: 0 % (ref 0.0–0.2)

## 2020-03-11 LAB — HEPATITIS PANEL, ACUTE
HCV Ab: NONREACTIVE
Hep A IgM: NONREACTIVE
Hep B C IgM: NONREACTIVE
Hepatitis B Surface Ag: NONREACTIVE

## 2020-03-11 LAB — PROTIME-INR
INR: 1 (ref 0.8–1.2)
Prothrombin Time: 12.7 seconds (ref 11.4–15.2)

## 2020-03-11 LAB — VITAMIN B12: Vitamin B-12: 265 pg/mL (ref 180–914)

## 2020-03-11 LAB — FERRITIN: Ferritin: 175 ng/mL (ref 24–336)

## 2020-03-11 LAB — LACTATE DEHYDROGENASE: LDH: 141 U/L (ref 98–192)

## 2020-03-11 LAB — PATHOLOGIST SMEAR REVIEW

## 2020-03-11 NOTE — Progress Notes (Signed)
Scott Olsen CONSULT NOTE  Patient Care Team: Patient, No Pcp Per as PCP - General (General Practice)  CHIEF COMPLAINTS/PURPOSE OF CONSULTATION:  Thrombocytopenia.  ASSESSMENT & PLAN:  No problem-specific Assessment & Plan notes found for this encounter.  No orders of the defined types were placed in this encounter.  This is a pleasant 35 year old male patient past medical history significant for nicotine abuse, alcohol use referred to hematology for evaluation of thrombocytopenia.  He also noted a small tender lymph node in his left neck which he reported to his primary care physician and given the concurrent thrombocytopenia he was referred to hematology for any additional recommendations.  He denies any B symptoms, change in general health, breathing, bowel habits or urinary habits.  He mostly reports alcohol intake on a regular basis but since last week he has not really engaged in drinking since he googled and found out that thrombocytopenia could be related to alcohol. Physical examination quite unremarkable, with a small lymph node in the submandibular region on the neck feels nonpathologic and is likely reactive.  He tells me that his primary doctor ordered a CT scan for further evaluation.  No hepatosplenomegaly noted.  I reviewed his labs for the past 6 years, he has had chronic thrombocytopenia with platelet count ranging between 80 and 90,000 for the past several years.  No leukopenia or anemia.  Have recommended some additional labs to look for other etiologies of thrombocytopenia such as nutritional deficiencies, hemolysis, smear review and CMP.  I do believe he may have some underlying liver disease from excessive alcohol intake and his thrombocytopenia is likely related to alcohol.  I would recommend further evaluation of liver disease, he will talk to his primary care physician about this.  Will attempt to see him back in 2 or 3 months and if his thrombocytopenia is  stable then he can continue follow-up with his primary care physician.  He was instructed to reach out to me if there is any unexpected lymphadenopathy on the CT imaging and he expressed understanding.  I have strongly encouraged alcohol cessation and discussed a few measures that he can consider to minimize the intake of alcohol.  We have also discussed smoking cessation.  He expressed understanding of all the recommendations.  Thank you for consulting [place] in the care of this patient.  Please not hesitate to contact us with any additional questions or concerns.  HISTORY OF PRESENTING ILLNESS:   Scott Olsen 35 y.o. male is here because of thrombocytopenia.  This is a pleasant 35 year old male patient with a past medical history significant for nicotine abuse quit about a month ago, regular alcohol intake referred to hematology for evaluation of thrombocytopenia and some tender lymphadenopathy in the left neck.  Scott Olsen arrived to the appointment today by himself.  He tells me that he has been drinking since 2006 but was not a heavy drinker up until 2016/2017.  Up until a week ago, he will drink most days except 1 day out of the weekend, 3-4 hard drinks and occasionally more.  He quit smoking about a month ago cold [place].  He has noticed some swelling in his left neck which according to the patient has since improved and currently mildly tender on palpation.  Prior to he noticing this swelling in his neck, he had some tonsillar irritation.  He denies any known autoimmune diseases, hepatitis, nutritional deficiencies.  No new medications except multivitamins.    He tells me that he  got lonely since his divorce and started to drink a bit more. He denies any ongoing bleeding, changing, bleeding, bowel habits or urinary habits.  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  Besides the tender lymph node in recent tonsillar irritation, he denies any major complaints.  Rest review of systems as mentioned  below and unremarkable  REVIEW OF SYSTEMS:   Constitutional: Denies fevers, chills or abnormal night sweats Eyes: Denies blurriness of vision, double vision or watery eyes Ears, nose, mouth, throat, and face: Denies mucositis or sore throat Respiratory: Denies cough, dyspnea or wheezes Cardiovascular: Denies palpitation, chest discomfort or lower extremity swelling Gastrointestinal:  Denies nausea, heartburn or change in bowel habits Skin: Denies abnormal skin rashes Lymphatics: Denies new lymphadenopathy or easy bruising Neurological:Denies numbness, tingling or new weaknesses Behavioral/Psych: Mood is stable, no new changes, he denies any depression. All other systems were reviewed with the patient and are negative.  MEDICAL HISTORY:  History reviewed. No pertinent past medical history.  SURGICAL HISTORY: History reviewed. No pertinent surgical history.  SOCIAL HISTORY: Social History   Socioeconomic History  . Marital status: Single    Spouse name: Not on file  . Number of children: Not on file  . Years of education: Not on file  . Highest education level: Not on file  Occupational History  . Not on file  Tobacco Use  . Smoking status: Former Smoker    Packs/day: 0.25    Years: 10.00    Pack years: 2.50    Types: Cigarettes    Quit date: 01/21/2015    Years since quitting: 5.1  . Smokeless tobacco: Not on file  Substance and Sexual Activity  . Alcohol use: Yes    Alcohol/week: 2.0 standard drinks    Types: 2 Standard drinks or equivalent per week  . Drug use: No  . Sexual activity: Yes    Birth control/protection: None  Other Topics Concern  . Not on file  Social History Narrative  . Not on file   Social Determinants of Health   Financial Resource Strain: Not on file  Food Insecurity: Not on file  Transportation Needs: Not on file  Physical Activity: Not on file  Stress: Not on file  Social Connections: Not on file  Intimate Partner Violence: Not on  file    FAMILY HISTORY: Family History  Problem Relation Age of Onset  . Diabetes Father   . Hypertension Father     ALLERGIES:  has No Known Allergies.  MEDICATIONS:  Current Outpatient Medications  Medication Sig Dispense Refill  . Multiple Vitamin (MULTIVITAMIN) tablet Take 1 tablet by mouth daily.     No current facility-administered medications for this visit.     PHYSICAL EXAMINATION: ECOG PERFORMANCE STATUS: 0 - Asymptomatic  Vitals:   03/11/20 1036  BP: 126/85  Pulse: 87  Resp: 18  Temp: (!) 97.2 F (36.2 C)  SpO2: 100%   Filed Weights   03/11/20 1036  Weight: 169 lb 6.4 oz (76.8 kg)    GENERAL:alert, no distress and comfortable SKIN: skin color, texture, turgor are normal, no rashes or significant lesions EYES: normal, conjunctiva are pink and non-injected, sclera clear OROPHARYNX:no exudate, no erythema and lips, buccal mucosa, and tongue normal  NECK: supple, thyroid normal size, non-tender, without nodularity LYMPH: Small nonpathologic lymph node noted in the left submandibular region.No significant lymphadenopathy on exam  LUNGS: clear to auscultation and percussion with normal breathing effort HEART: regular rate & rhythm and no murmurs and  no lower extremity edema ABDOMEN:abdomen soft, non-tender and normal bowel sounds.  No palpable hepatosplenomegaly Musculoskeletal:no cyanosis of digits and no clubbing  PSYCH: alert & oriented x 3 with fluent speech NEURO: no focal motor/sensory deficits  LABORATORY DATA:  I have reviewed the data as listed Lab Results  Component Value Date   WBC 9.8 03/15/2016   HGB 15.0 03/15/2016   HCT 42.5 03/15/2016   MCV 88.7 03/15/2016   PLT 88 (L) 03/15/2016     Chemistry      Component Value Date/Time   NA 135 03/15/2016 0742   K 3.9 03/15/2016 0742   CL 100 (L) 03/15/2016 0742   CO2 22 03/15/2016 0742   BUN 8 03/15/2016 0742   CREATININE 1.22 03/15/2016 0742   CREATININE 1.07 01/28/2015 1421       Component Value Date/Time   CALCIUM 9.6 03/15/2016 0742   ALKPHOS 53 03/15/2016 0742   AST 32 03/15/2016 0742   ALT 45 03/15/2016 0742   BILITOT 1.0 03/15/2016 0742     I reviewed his labs over the past 5 to 6 years, he has had chronic thrombocytopenia with platelet count ranging from 80-90,000  RADIOGRAPHIC STUDIES: I have personally reviewed the radiological images as listed and agreed with the findings in the report. No results found.  All questions were answered. The patient knows to call the clinic with any problems, questions or concerns. I spent 45 minutes in the care of this patient including H and P, review of records, counseling and coordination of care.     Rachel Moulds, MD 03/11/2020 11:18 AM

## 2020-03-12 ENCOUNTER — Other Ambulatory Visit: Payer: Self-pay

## 2020-03-12 LAB — FOLATE RBC
Folate, Hemolysate: 191 ng/mL
Folate, RBC: 406 ng/mL — ABNORMAL LOW (ref >498)
Hematocrit: 47.1 % (ref 37.5–51.0)

## 2020-03-15 ENCOUNTER — Telehealth: Payer: Self-pay

## 2020-03-15 NOTE — Telephone Encounter (Signed)
Patient called inquiring about lab results from last week. Informed patient that Dr. Al Pimple is out of the office today but in her notes mentioned patient taking Folic Acid 1 mg daily. Patient verbalized understanding. Patient also stated he has been dizzy and having numbness in lower extremities. Instructed patient to go Urgent Care or contact PCP if he is having dizziness and numbness. Patient verbalized understanding.

## 2020-03-18 ENCOUNTER — Ambulatory Visit
Admission: RE | Admit: 2020-03-18 | Discharge: 2020-03-18 | Disposition: A | Discharge status: Home / Self Care | Payer: 59 | Source: Ambulatory Visit | Attending: Internal Medicine | Admitting: Internal Medicine

## 2020-03-18 ENCOUNTER — Other Ambulatory Visit: Payer: Self-pay

## 2020-03-18 DIAGNOSIS — R599 Enlarged lymph nodes, unspecified: Secondary | ICD-10-CM

## 2020-03-18 MED ORDER — IOPAMIDOL (ISOVUE-300) INJECTION 61%
75.0000 mL | Freq: Once | INTRAVENOUS | Status: AC | PRN
  Administered 2020-03-18: 75 mL via INTRAVENOUS

## 2020-05-06 ENCOUNTER — Inpatient Hospital Stay: Payer: 59 | Attending: Hematology and Oncology | Admitting: Hematology and Oncology

## 2020-05-06 ENCOUNTER — Telehealth: Payer: Self-pay

## 2020-05-06 NOTE — Telephone Encounter (Signed)
Left voicemail for patient regarding today's appointment. Provided number to call the office to reschedule appointment.

## 2020-06-22 ENCOUNTER — Encounter (HOSPITAL_COMMUNITY): Payer: Self-pay

## 2020-06-22 ENCOUNTER — Ambulatory Visit (HOSPITAL_COMMUNITY)
Admission: EM | Admit: 2020-06-22 | Discharge: 2020-06-22 | Disposition: A | Discharge status: Home / Self Care | Payer: 59 | Attending: Family Medicine | Admitting: Family Medicine

## 2020-06-22 ENCOUNTER — Other Ambulatory Visit: Payer: Self-pay

## 2020-06-22 DIAGNOSIS — D696 Thrombocytopenia, unspecified: Secondary | ICD-10-CM | POA: Diagnosis present

## 2020-06-22 DIAGNOSIS — R1032 Left lower quadrant pain: Secondary | ICD-10-CM | POA: Diagnosis present

## 2020-06-22 DIAGNOSIS — R21 Rash and other nonspecific skin eruption: Secondary | ICD-10-CM | POA: Diagnosis present

## 2020-06-22 LAB — CBC WITH DIFFERENTIAL/PLATELET
Abs Immature Granulocytes: 0.03 10*3/uL (ref 0.00–0.07)
Basophils Absolute: 0 10*3/uL (ref 0.0–0.1)
Basophils Relative: 1 %
Eosinophils Absolute: 0 10*3/uL (ref 0.0–0.5)
Eosinophils Relative: 1 %
HCT: 44.8 % (ref 39.0–52.0)
Hemoglobin: 15.4 g/dL (ref 13.0–17.0)
Immature Granulocytes: 1 %
Lymphocytes Relative: 25 %
Lymphs Abs: 1.7 10*3/uL (ref 0.7–4.0)
MCH: 31.2 pg (ref 26.0–34.0)
MCHC: 34.4 g/dL (ref 30.0–36.0)
MCV: 90.7 fL (ref 80.0–100.0)
Monocytes Absolute: 0.4 10*3/uL (ref 0.1–1.0)
Monocytes Relative: 6 %
Neutro Abs: 4.5 10*3/uL (ref 1.7–7.7)
Neutrophils Relative %: 66 %
Platelets: 148 10*3/uL — ABNORMAL LOW (ref 150–400)
RBC: 4.94 MIL/uL (ref 4.22–5.81)
RDW: 12.1 % (ref 11.5–15.5)
WBC: 6.6 10*3/uL (ref 4.0–10.5)
nRBC: 0 % (ref 0.0–0.2)

## 2020-06-22 LAB — POCT URINALYSIS DIPSTICK, ED / UC
Bilirubin Urine: NEGATIVE
Glucose, UA: NEGATIVE mg/dL
Hgb urine dipstick: NEGATIVE
Ketones, ur: NEGATIVE mg/dL
Leukocytes,Ua: NEGATIVE
Nitrite: NEGATIVE
Protein, ur: NEGATIVE mg/dL
Specific Gravity, Urine: 1.02 (ref 1.005–1.030)
Urobilinogen, UA: 0.2 mg/dL (ref 0.0–1.0)
pH: 8.5 — ABNORMAL HIGH (ref 5.0–8.0)

## 2020-06-22 MED ORDER — KETOCONAZOLE 2 % EX CREA: 1.0000 "application " | TOPICAL_CREAM | Freq: Every day | CUTANEOUS | 0 refills | Status: AC

## 2020-06-22 MED ORDER — TRIAMCINOLONE ACETONIDE 0.1 % EX CREA: 1.0000 "application " | TOPICAL_CREAM | Freq: Two times a day (BID) | CUTANEOUS | 0 refills | Status: AC

## 2020-06-22 NOTE — ED Triage Notes (Signed)
Pt reports itchy rash in the left leg since this morning.

## 2020-06-26 NOTE — ED Provider Notes (Signed)
[facility]    [name]: [id number] Arrival date & time: 06/22/20  1243      History   Chief Complaint Chief Complaint  Patient presents with  . Rash    HPI Scott Olsen is a 35 y.o. male.   Here today with an itchy rash on his left upper leg that he first noticed this morning.  He denies any recent environmental exposures, new hygiene products, new medications or supplements.  Has not tried anything over-the-counter to treat it.  States he has a history of thrombocytopenia and his primary care provider told him to be seen right away if he ever developed any sort of rashes.  Denies fever, chills, body aches, drainage from the area.  He also has a different area that is been ongoing in the groin, upper thigh area on the side that is very itchy and seems to be worse when he gets very sweaty or works out frequently.  Also has not tried anything on this area. He additionally has some concerns about some sharp shooting instantaneous left groin pain with ejaculation.  He states this has been going on for several weeks now and has been consistent each time.  Pain resolves after a momentary shooting pain and he is asymptomatic subsequently.  Denies bloody ejaculate, dysuria, hematuria, constant pelvic pain, penile discharge, new sexual partners.  No past history of GU issues.     History reviewed. No pertinent past medical history.  There are no problems to display for this patient.   History reviewed. No pertinent surgical history.     Home Medications    Prior to Admission medications   Medication Sig Start Date End Date Taking? Authorizing Provider  ketoconazole (NIZORAL) 2 % cream Apply 1 application topically daily. Try this cream on the upper groin rash that's been ongoing 06/22/20  Yes Particia Nearing, PA-C  triamcinolone cream (KENALOG) 0.1 % Apply 1 application topically 2 (two) times daily. Apply to new rash on thigh twice daily as needed to see if  this helps 06/22/20  Yes Particia Nearing, PA-C  Multiple Vitamin (MULTIVITAMIN) tablet Take 1 tablet by mouth daily.    [provider]    Family History Family History  Problem Relation Age of Onset  . Diabetes Father   . Hypertension Father     Social History Social History   Tobacco Use  . Smoking status: Former Smoker    Packs/day: 0.25    Years: 10.00    Pack years: 2.50    Types: Cigarettes    Quit date: 01/21/2015    Years since quitting: 5.4  . Smokeless tobacco: Never Used  Substance Use Topics  . Alcohol use: Yes    Alcohol/week: 2.0 standard drinks    Types: 2 Standard drinks or equivalent per week  . Drug use: No     Allergies   Patient has no known allergies.   Review of Systems Review of Systems Per HPI  Physical Exam Triage Vital Signs ED Triage Vitals  Enc Vitals Group     BP 06/22/20 1328 (!) 134/92     Pulse Rate 06/22/20 1328 77     Resp 06/22/20 1328 17     Temp 06/22/20 1328 97.9 F (36.6 C)     Temp Source 06/22/20 1328 Oral     SpO2 06/22/20 1328 100 %     Weight --      Height --      Head Circumference --  Peak Flow --      Pain Score 06/22/20 1327 0     Pain Loc --      Pain Edu? --      Excl. in GC? --    No data found.  Updated Vital Signs BP (!) 134/92 (BP Location: Right Arm)   Pulse 77   Temp 97.9 F (36.6 C) (Oral)   Resp 17   SpO2 100%   Visual Acuity Right Eye Distance:   Left Eye Distance:   Bilateral Distance:    Right Eye Near:   Left Eye Near:    Bilateral Near:     Physical Exam Vitals and nursing note reviewed.  Constitutional:      Appearance: Normal appearance.  HENT:     Head: Atraumatic.     Mouth/Throat:     Mouth: Mucous membranes are moist.     Pharynx: Oropharynx is clear.  Eyes:     Extraocular Movements: Extraocular movements intact.     Conjunctiva/sclera: Conjunctivae normal.  Cardiovascular:     Rate and Rhythm: Normal rate and regular rhythm.  Pulmonary:      Effort: Pulmonary effort is normal.     Breath sounds: Normal breath sounds.  Abdominal:     General: Bowel sounds are normal. There is no distension.     Palpations: Abdomen is soft.     Tenderness: There is no abdominal tenderness. There is no right CVA tenderness, left CVA tenderness or guarding.  Genitourinary:    Comments: GU exam declined, self swab performed Musculoskeletal:        General: Normal range of motion.     Cervical back: Normal range of motion and neck supple.  Skin:    General: Skin is warm and dry.     Findings: Rash present.     Comments: Scaly raised erythematous circular rash left medial upper thigh near groin fold Linear erythematous scabbing rash, 1 to 2 inches in each Upper left leg  Neurological:     General: No focal deficit present.     Mental Status: He is oriented to person, place, and time.  Psychiatric:        Mood and Affect: Mood normal.        Thought Content: Thought content normal.        Judgment: Judgment normal.    UC Treatments / Results  Labs (all labs ordered are listed, but only abnormal results are displayed) Labs Reviewed  CBC WITH DIFFERENTIAL/PLATELET - Abnormal; Notable for the following components:      Result Value   Platelets 148 (*)    All other components within normal limits  POCT URINALYSIS DIPSTICK, ED / UC - Abnormal; Notable for the following components:   pH 8.5 (*)    All other components within normal limits  CYTOLOGY, (ORAL, ANAL, URETHRAL) ANCILLARY ONLY    EKG   Radiology No results found.  Procedures Procedures (including critical care time)  Medications Ordered in UC Medications - No data to display  Initial Impression / Assessment and Plan / UC Course  I have reviewed the triage vital signs and the nursing notes.  Pertinent labs & imaging results that were available during my care of the patient were reviewed by me and considered in my medical decision making (see chart for details).      Examined vitals reassuring overall.  Regarding his rashes, rash nearest to groin fold is very consistent with a fungal rash so we will treat with  ketoconazole cream, keep area clean and dry.  The other rash is less specific but does look most consistent with an allergic dermatitis so we will treat with Kenalog cream and monitor for benefit.  Does not at least at this time appear to have anything to do with his thrombocytopenia but will check blood counts for reassurance purposes on this. Regarding his sharp intermittent groin pain with ejaculation, no abnormalities on exam and UA without evidence of a urinary tract infection.  Cytology swab pending to rule out penile infections, discussed possibly following up with urology if issue persists.  Final Clinical Impressions(s) / UC Diagnoses   Final diagnoses:  Rash  Thrombocytopenia (HCC)  Left groin pain   Discharge Instructions   None    ED Prescriptions    Medication Sig Dispense Auth. Provider   ketoconazole (NIZORAL) 2 % cream Apply 1 application topically daily. Try this cream on the upper groin rash that's been ongoing 60 g Particia Nearing, PA-C   triamcinolone cream (KENALOG) 0.1 % Apply 1 application topically 2 (two) times daily. Apply to new rash on thigh twice daily as needed to see if this helps 30 g Particia Nearing, PA-C     PDMP not reviewed this encounter.   Particia Nearing, [place] 06/26/20 1043

## 2020-06-29 LAB — CYTOLOGY, (ORAL, ANAL, URETHRAL) ANCILLARY ONLY
Chlamydia: NEGATIVE
Comment: NEGATIVE
Comment: NEGATIVE
Comment: NORMAL
Neisseria Gonorrhea: NEGATIVE
Trichomonas: NEGATIVE

## 2020-07-13 ENCOUNTER — Other Ambulatory Visit: Payer: Self-pay | Admitting: Internal Medicine

## 2020-07-13 DIAGNOSIS — R109 Unspecified abdominal pain: Secondary | ICD-10-CM

## 2020-07-14 ENCOUNTER — Ambulatory Visit
Admission: RE | Admit: 2020-07-14 | Discharge: 2020-07-14 | Disposition: A | Discharge status: Home / Self Care | Payer: 59 | Source: Ambulatory Visit | Attending: Internal Medicine | Admitting: Internal Medicine

## 2020-07-14 DIAGNOSIS — R109 Unspecified abdominal pain: Secondary | ICD-10-CM

## 2020-08-13 ENCOUNTER — Other Ambulatory Visit: Payer: Self-pay | Admitting: Gastroenterology

## 2020-08-13 DIAGNOSIS — K769 Liver disease, unspecified: Secondary | ICD-10-CM

## 2020-08-26 ENCOUNTER — Ambulatory Visit
Admission: RE | Admit: 2020-08-26 | Discharge: 2020-08-26 | Disposition: A | Discharge status: Home / Self Care | Payer: 59 | Source: Ambulatory Visit | Attending: Gastroenterology | Admitting: Gastroenterology

## 2020-08-26 DIAGNOSIS — K769 Liver disease, unspecified: Secondary | ICD-10-CM

## 2020-08-26 MED ORDER — GADOBENATE DIMEGLUMINE 529 MG/ML IV SOLN
14.0000 mL | Freq: Once | INTRAVENOUS | Status: AC | PRN
  Administered 2020-08-26: 14 mL via INTRAVENOUS

## 2020-12-22 ENCOUNTER — Other Ambulatory Visit: Payer: Self-pay

## 2020-12-22 ENCOUNTER — Telehealth: Payer: Self-pay

## 2020-12-22 DIAGNOSIS — D696 Thrombocytopenia, unspecified: Secondary | ICD-10-CM

## 2020-12-22 NOTE — Telephone Encounter (Signed)
Attempted to return Mr. Berkley's phone call. He left a VM stating he has red spots on his upper thighs. I notified Karena Addison, [place] who ordered labs for him. I scheduled him an appt for tomorrow, 11/17, at 0915. I left a VM on his phone stating name and call back number to notify him of this. Will follow up.

## 2020-12-23 ENCOUNTER — Telehealth: Payer: Self-pay

## 2020-12-23 ENCOUNTER — Inpatient Hospital Stay: Payer: 59 | Attending: Hematology and Oncology

## 2020-12-23 DIAGNOSIS — D696 Thrombocytopenia, unspecified: Secondary | ICD-10-CM | POA: Insufficient documentation

## 2020-12-23 LAB — CBC WITH DIFFERENTIAL (CANCER CENTER ONLY)
Abs Immature Granulocytes: 0.02 10*3/uL (ref 0.00–0.07)
Basophils Absolute: 0 10*3/uL (ref 0.0–0.1)
Basophils Relative: 1 %
Eosinophils Absolute: 0.1 10*3/uL (ref 0.0–0.5)
Eosinophils Relative: 1 %
HCT: 42.5 % (ref 39.0–52.0)
Hemoglobin: 15.1 g/dL (ref 13.0–17.0)
Immature Granulocytes: 0 %
Lymphocytes Relative: 35 %
Lymphs Abs: 2.3 10*3/uL (ref 0.7–4.0)
MCH: 31.7 pg (ref 26.0–34.0)
MCHC: 35.5 g/dL (ref 30.0–36.0)
MCV: 89.3 fL (ref 80.0–100.0)
Monocytes Absolute: 0.5 10*3/uL (ref 0.1–1.0)
Monocytes Relative: 7 %
Neutro Abs: 3.7 10*3/uL (ref 1.7–7.7)
Neutrophils Relative %: 56 %
Platelet Count: 102 10*3/uL — ABNORMAL LOW (ref 150–400)
RBC: 4.76 MIL/uL (ref 4.22–5.81)
RDW: 12.4 % (ref 11.5–15.5)
WBC Count: 6.6 10*3/uL (ref 4.0–10.5)
nRBC: 0 % (ref 0.0–0.2)

## 2020-12-23 LAB — CMP (CANCER CENTER ONLY)
ALT: 32 U/L (ref 0–44)
AST: 18 U/L (ref 15–41)
Albumin: 4.5 g/dL (ref 3.5–5.0)
Alkaline Phosphatase: 55 U/L (ref 38–126)
Anion gap: 9 (ref 5–15)
BUN: 12 mg/dL (ref 6–20)
CO2: 24 mmol/L (ref 22–32)
Calcium: 9.2 mg/dL (ref 8.9–10.3)
Chloride: 105 mmol/L (ref 98–111)
Creatinine: 1.29 mg/dL — ABNORMAL HIGH (ref 0.61–1.24)
GFR, Estimated: >60 mL/min (ref >60)
Glucose, Bld: 121 mg/dL — ABNORMAL HIGH (ref 70–99)
Potassium: 4.2 mmol/L (ref 3.5–5.1)
Sodium: 138 mmol/L (ref 135–145)
Total Bilirubin: 1.2 mg/dL (ref 0.3–1.2)
Total Protein: 7.5 g/dL (ref 6.5–8.1)

## 2020-12-23 LAB — PROTIME-INR
INR: 1 (ref 0.8–1.2)
Prothrombin Time: 13 seconds (ref 11.4–15.2)

## 2020-12-23 NOTE — Telephone Encounter (Signed)
I reached out to Mr. Kauffmann this morning and told him that his lab appt was at 0915. He stated he would be able to come and I told him that they would draw labs and then we would go from there. Understanding verbalized. All questions answered.

## 2020-12-23 NOTE — Telephone Encounter (Signed)
I spoke with Scott Heron, NP about Mr. Scott Olsen lab results. I called him to inform him that his platelets were low, but still above 100. He stated that the red spots on his leg were beginning to go away and normally, within 2-3 days, they go away completely. I advised Mr. Scott Olsen to continue to watch them and, if they aren't going away or get worse, to follow up with his PCP. I asked about bleeding and he stated that he hasn't had nose bleeds, but that his gums do bleed when brushing his teeth but that this stops. I told him that if there is bleeding that is not able to be stopped, he should go to the ED. Understanding verbalized. All questions answered. Advised Mr. Scott Olsen to call back with any questions/concerns.

## 2021-03-11 ENCOUNTER — Other Ambulatory Visit: Payer: Self-pay | Admitting: Gastroenterology

## 2021-03-11 DIAGNOSIS — K769 Liver disease, unspecified: Secondary | ICD-10-CM

## 2021-03-18 ENCOUNTER — Other Ambulatory Visit: Payer: Self-pay

## 2021-03-18 ENCOUNTER — Ambulatory Visit
Admission: RE | Admit: 2021-03-18 | Discharge: 2021-03-18 | Disposition: A | Discharge status: Home / Self Care | Payer: 59 | Source: Ambulatory Visit | Attending: Gastroenterology | Admitting: Gastroenterology

## 2021-03-18 DIAGNOSIS — K769 Liver disease, unspecified: Secondary | ICD-10-CM

## 2022-05-15 IMAGING — MR MR ABDOMEN WO/W CM
12 of 17 series · 26 of 48 positions shown · IV contrast (multihance)
Comparison: Right upper quadrant abdomen ultrasound on 07/14/2020

CLINICAL DATA: Abdominal pain.  Liver lesion on recent ultrasound.

EXAM:
MRI ABDOMEN WITHOUT AND WITH CONTRAST
TECHNIQUE: Multiplanar multisequence MR imaging of the abdomen was performed
both before and after the administration of intravenous contrast.
CONTRAST:  14mL MULTIHANCE GADOBENATE DIMEGLUMINE 529 MG/ML IV SOLN

[Series 10: cor haste · coronal · 4.5mm · 0.74mm/px · 1 of 43 slices shown]
[im 1/43]
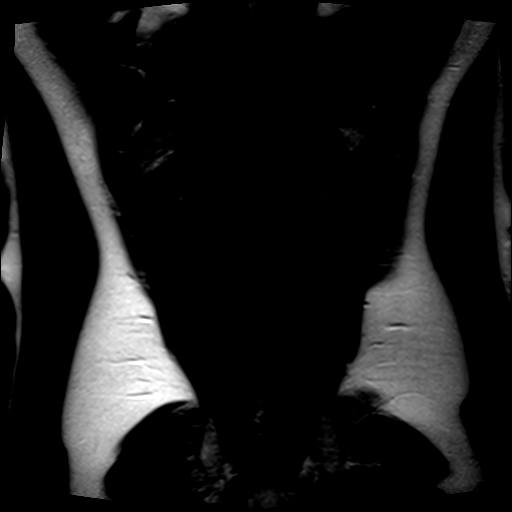

[Series 11: bSSFP · axial · 4.0mm · 0.68mm/px · 1 of 61 slices shown]
[im 1/61]
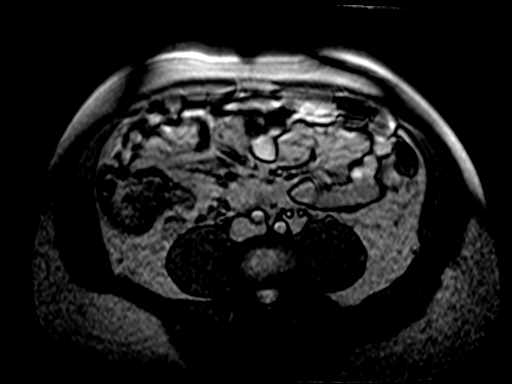

[Series 12: axial haste · axial · 6.0mm · 0.68mm/px · 1 of 37 slices shown]
[im 1/37]
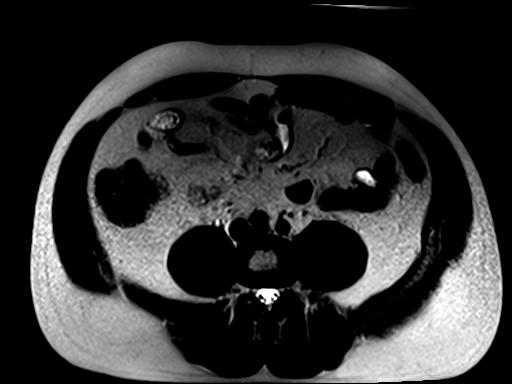

[Series 13: T1 · axial · 6.0mm · 0.68mm/px · 1 of 66 slices shown]
[im 1/66]
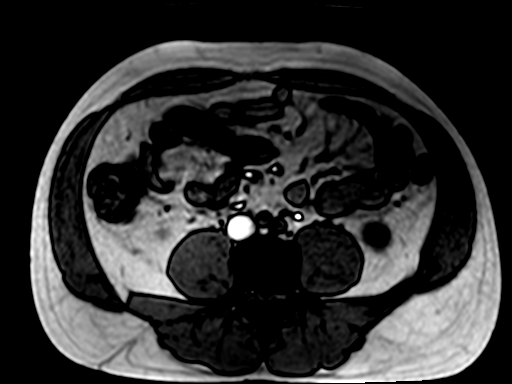

[Series 16: T2 fat-sat · axial · 6.0mm · 1.09mm/px · 1 of 33 slices shown]
[im 1/33]
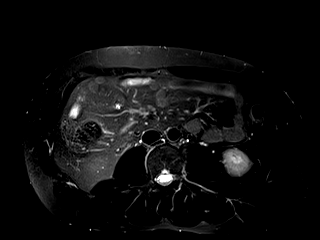

[Series 17: [id number]_trig · axial · 6.0mm · 1.82mm/px · z∈[+35,+257]mm · 3 of 96 slices shown]
[im 1/96]
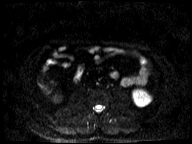
[im 48/96]
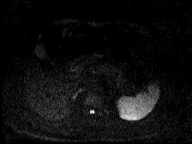
[im 96/96]
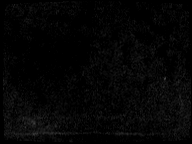

[Series 18: [id number]_trig_adc · axial · 6.0mm · 1.82mm/px · 1 of 32 slices shown]
[im 1/32]
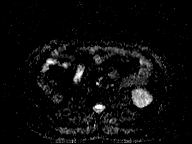

[Series 19: T1 dynamic · axial · non-contrast · 2.5mm · 0.74mm/px · z∈[-72,+185]mm · 4 of 104 slices shown]
[im 1/104]
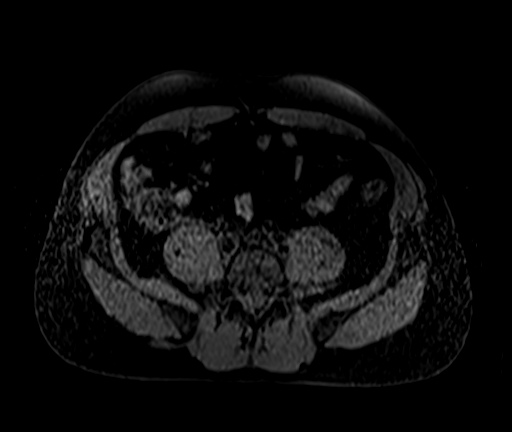
[im 35/104]
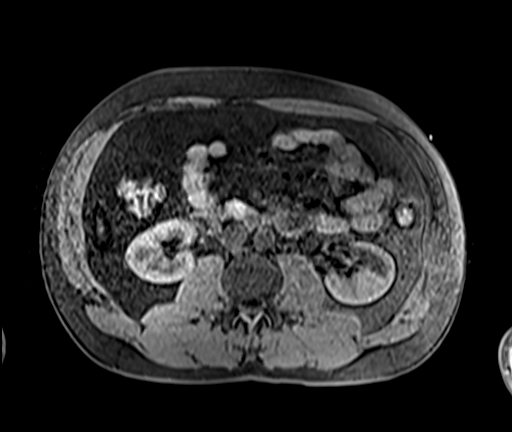
[im 69/104]
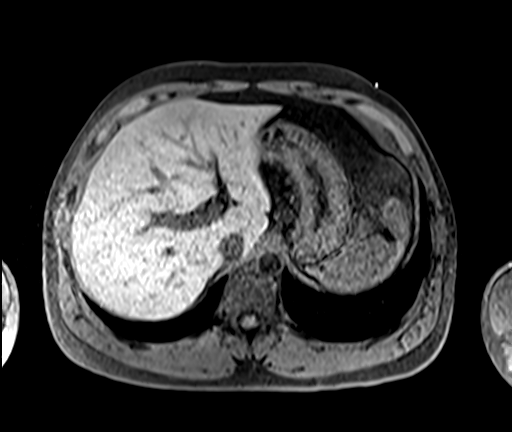
[im 104/104]
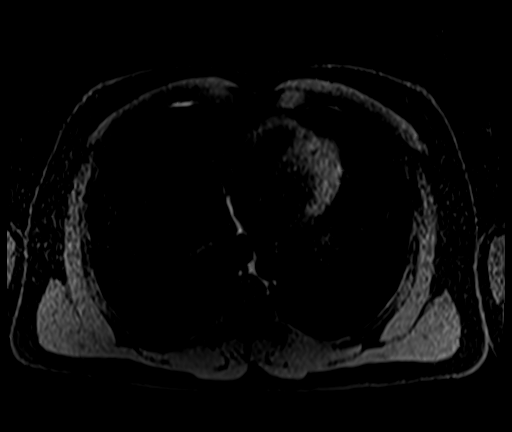

[Series 20: T1 dynamic post-contrast · axial · 2.5mm · 0.74mm/px · z∈[-72,+185]mm · 4 of 104 slices shown (1 of 4)]
[im 1/104]
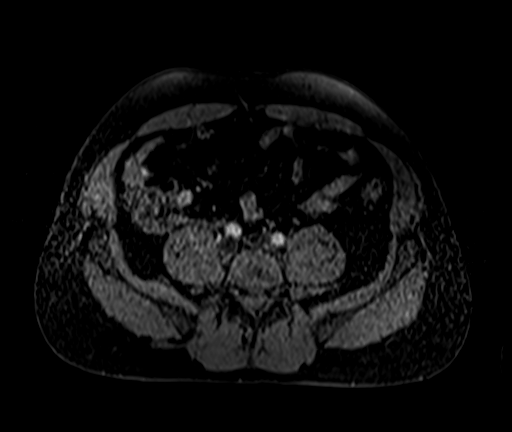
[im 35/104]
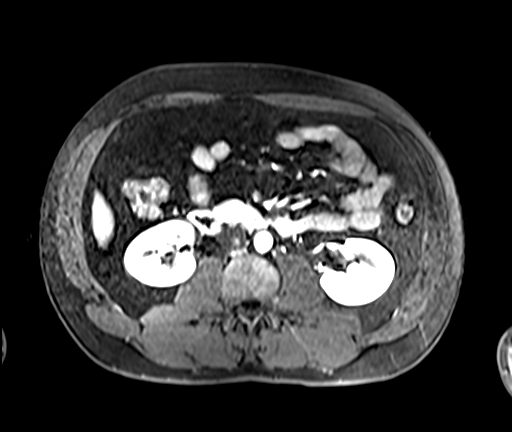
[im 69/104]
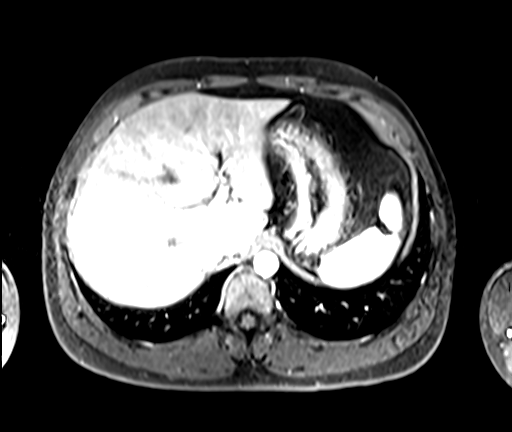
[im 104/104]
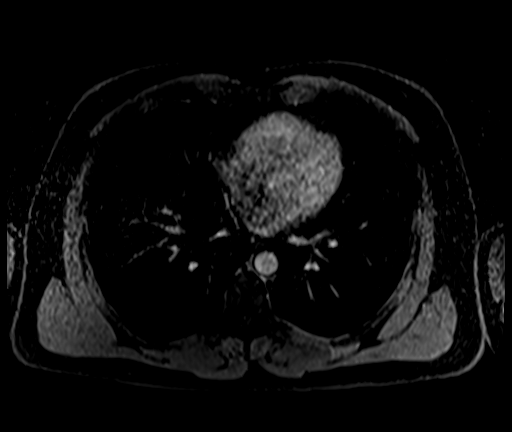

[Series 21: T1 dynamic post-contrast · axial · 2.5mm · 0.74mm/px · z∈[-72,+185]mm · 4 of 104 slices shown (2 of 4)]
[im 1/104]
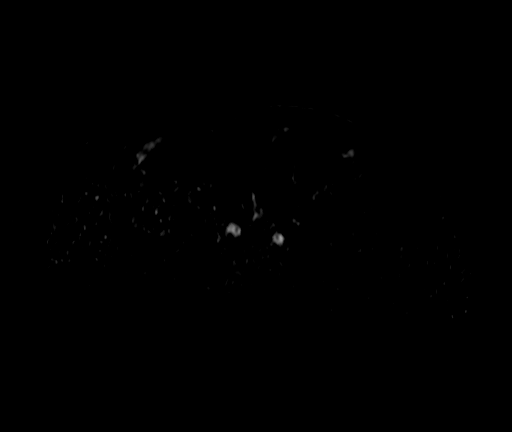
[im 35/104]
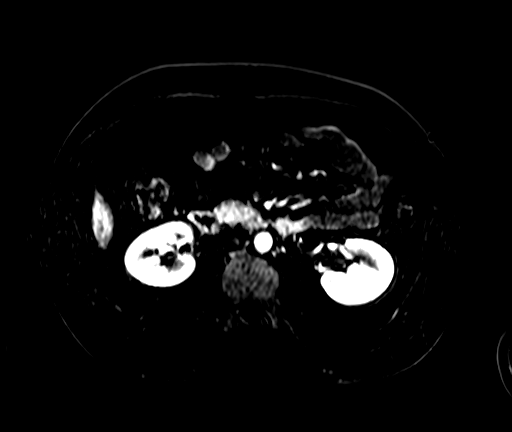
[im 69/104]
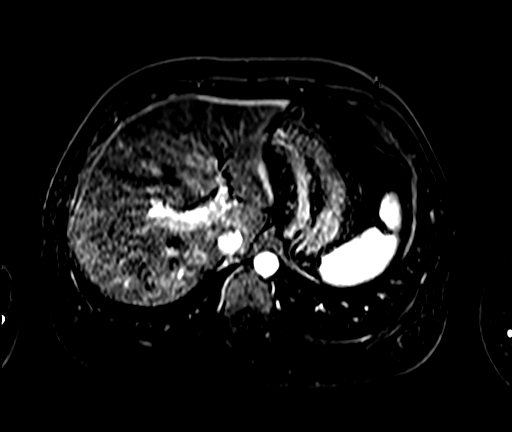
[im 104/104]
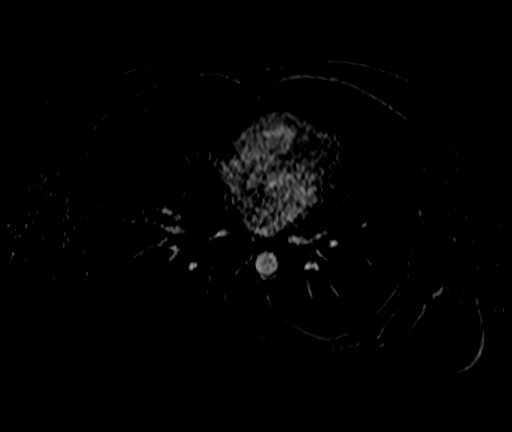

[Series 22: T1 dynamic post-contrast · axial · 2.5mm · 0.74mm/px · z∈[-72,+185]mm · 4 of 104 slices shown (3 of 4)]
[im 1/104]
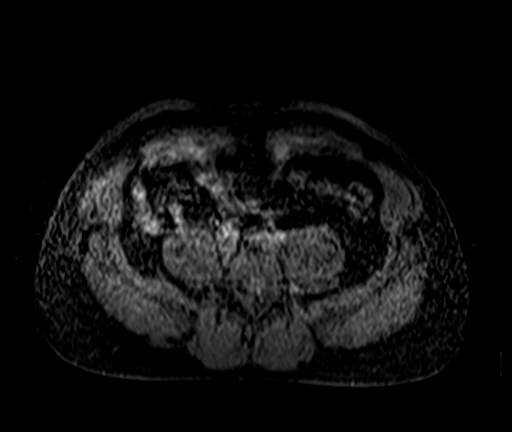
[im 35/104]
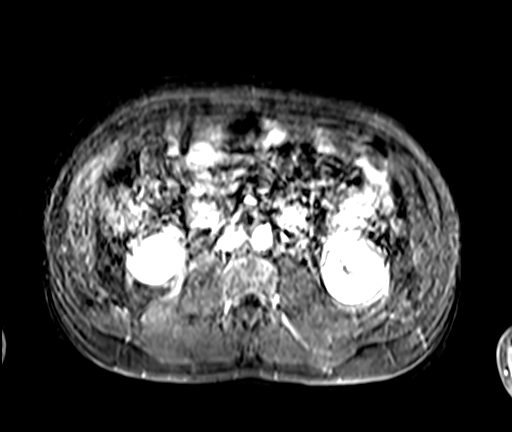
[im 69/104]
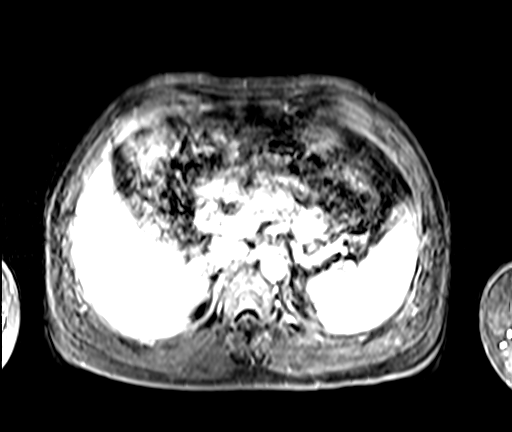
[im 104/104]
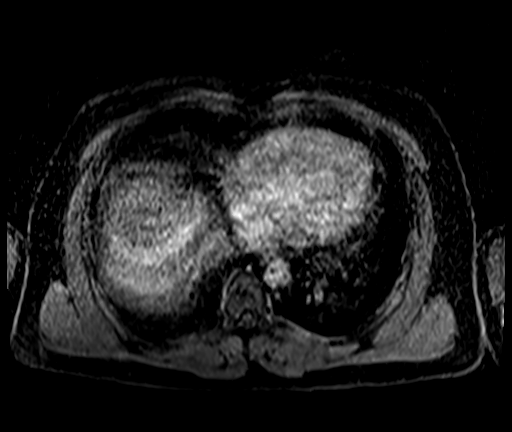

[Series 23: T1 dynamic post-contrast · axial · 2.5mm · 0.74mm/px · 1 of 104 slices shown (4 of 4)]
[im 1/104]
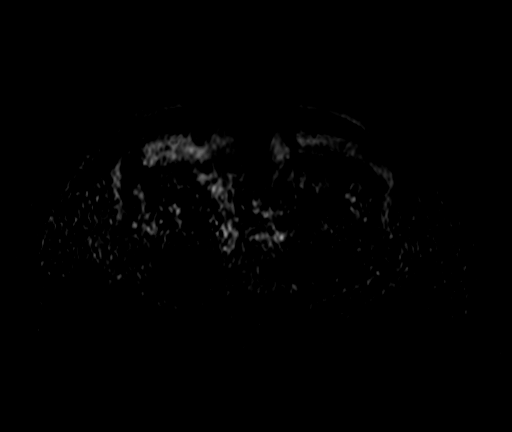

[26 of 48 positions shown; findings below may reference images not displayed]

FINDINGS: Lower chest: No acute findings.

Hepatobiliary: No hepatic masses identified. The tiny lesion seen on
prior ultrasound is not visualized today's exam, however this may be
due to its small size. Gallbladder is unremarkable. No evidence of
biliary ductal dilatation.

Pancreas:  No mass or inflammatory changes.

Spleen:  Within normal limits in size and appearance.

Adrenals/Urinary Tract: No masses identified. No evidence of
hydronephrosis.

Stomach/Bowel: Visualized portion unremarkable.

Vascular/Lymphatic: No pathologically enlarged lymph nodes
identified. No acute vascular findings.

Reproductive: Normal size prostate.  No abnormality identified.

Other:  None.

Musculoskeletal:  No suspicious bone lesions identified.
IMPRESSION: Negative. No significant abnormality identified in the abdomen or
pelvis.

No liver mass identified. The sub-cm lesion seen on prior ultrasound
is not visualized today's exam, which may be due to its tiny size.
Suggest follow-up by ultrasound again in 6 months.

## 2022-12-12 ENCOUNTER — Telehealth: Payer: Self-pay | Admitting: Hematology and Oncology

## 2022-12-12 NOTE — Telephone Encounter (Signed)
 Left patient a vm regarding upcoming appointment

## 2022-12-27 ENCOUNTER — Other Ambulatory Visit: Payer: Self-pay | Admitting: *Deleted

## 2022-12-27 DIAGNOSIS — D696 Thrombocytopenia, unspecified: Secondary | ICD-10-CM

## 2022-12-28 ENCOUNTER — Inpatient Hospital Stay: Payer: 59 | Attending: Hematology and Oncology | Admitting: Hematology and Oncology

## 2022-12-28 ENCOUNTER — Inpatient Hospital Stay: Payer: 59

## 2023-01-19 ENCOUNTER — Encounter: Payer: Self-pay | Admitting: *Deleted
